# Patient Record
Sex: Male | Born: 1985 | Race: Black or African American | Hispanic: No | Marital: Single | Smoking: Former smoker
Health system: Southern US, Community
[De-identification: ages and names within clinical notes are randomized; demographics above are authoritative.]

## PROBLEM LIST (undated history)

## (undated) HISTORY — PX: HAND SURGERY: SHX662

---

## 2017-09-11 ENCOUNTER — Encounter: Payer: Self-pay | Admitting: Emergency Medicine

## 2017-09-11 ENCOUNTER — Emergency Department
Admission: EM | Admit: 2017-09-11 | Discharge: 2017-09-11 | Disposition: A | Discharge status: Home / Self Care | Payer: Self-pay | Attending: Emergency Medicine | Admitting: Emergency Medicine

## 2017-09-11 ENCOUNTER — Other Ambulatory Visit: Payer: Self-pay

## 2017-09-11 DIAGNOSIS — N342 Other urethritis: Secondary | ICD-10-CM | POA: Insufficient documentation

## 2017-09-11 DIAGNOSIS — Z202 Contact with and (suspected) exposure to infections with a predominantly sexual mode of transmission: Secondary | ICD-10-CM | POA: Insufficient documentation

## 2017-09-11 LAB — URINALYSIS, COMPLETE (UACMP) WITH MICROSCOPIC
BILIRUBIN URINE: NEGATIVE
Bacteria, UA: NONE SEEN
GLUCOSE, UA: NEGATIVE mg/dL
HGB URINE DIPSTICK: NEGATIVE
Ketones, ur: NEGATIVE mg/dL
NITRITE: NEGATIVE
PROTEIN: NEGATIVE mg/dL
Specific Gravity, Urine: 1.021 (ref 1.005–1.030)
Squamous Epithelial / LPF: NONE SEEN (ref 0–5)
pH: 6 (ref 5.0–8.0)

## 2017-09-11 LAB — CHLAMYDIA/NGC RT PCR (ARMC ONLY)
Chlamydia Tr: NOT DETECTED
N gonorrhoeae: DETECTED — AB

## 2017-09-11 MED ORDER — AZITHROMYCIN 500 MG PO TABS
1000.0000 mg | ORAL_TABLET | Freq: Once | ORAL | Status: AC
  Administered 2017-09-11: 1000 mg via ORAL
  Filled 2017-09-11: qty 2

## 2017-09-11 MED ORDER — CEFTRIAXONE SODIUM 1 G IJ SOLR
1.0000 g | Freq: Once | INTRAMUSCULAR | Status: AC
  Administered 2017-09-11: 1 g via INTRAMUSCULAR
  Filled 2017-09-11: qty 10

## 2017-09-11 MED ORDER — LIDOCAINE HCL (PF) 1 % IJ SOLN
5.0000 mL | Freq: Once | INTRAMUSCULAR | Status: AC
  Administered 2017-09-11: 5 mL
  Filled 2017-09-11: qty 5

## 2017-09-11 NOTE — Discharge Instructions (Signed)
Follow-up with the [facility] STD clinic for further testing or treatment if needed.  The services are free.  If you are able to contact your sex partner they need to know that they also need to be treated.

## 2017-09-11 NOTE — ED Provider Notes (Signed)
[facility] Emergency Department Provider Note  ____________________________________________   First MD Initiated Contact with Patient 09/11/17 [phone]     (approximate)  I have reviewed the triage vital signs and the nursing notes.   HISTORY  Chief Complaint Exposure to STD   HPI Walter Montoya is a 32 y.o. male is here with complaint of penile discharge.  Patient states he had a one night stand prior to this.  Patient states that he had a condom on for intercourse but had oral sex prior to.  Patient denies any previous urinary infections or kidney stones.  History reviewed. No pertinent past medical history.  There are no active problems to display for this patient.   History reviewed. No pertinent surgical history.  Prior to Admission medications   Not on File    Allergies Patient has no known allergies.  No family history on file.  Social History Social History   Tobacco Use  . Smoking status: Not on file  Substance Use Topics  . Alcohol use: Not on file  . Drug use: Not on file    Review of Systems Constitutional: No fever/chills Cardiovascular: Denies chest pain. Respiratory: Denies shortness of breath. Gastrointestinal:  No nausea, no vomiting. Genitourinary: Positive for dysuria. ____________________________________________   PHYSICAL EXAM:  VITAL SIGNS: ED Triage Vitals  Enc Vitals Group     BP 09/11/17 0914 (!) 148/87     Pulse Rate 09/11/17 0914 67     Resp 09/11/17 0914 18     Temp 09/11/17 0914 98.1 F (36.7 C)     Temp Source 09/11/17 0914 Oral     SpO2 09/11/17 0914 97 %     Weight 09/11/17 0915 180 lb (81.6 kg)     Height 09/11/17 0915 5\' 11"  (1.803 m)     Head Circumference --      Peak Flow --      Pain Score 09/11/17 0915 0     Pain Loc --      Pain Edu? --      Excl. in GC? --     Constitutional: Alert and oriented. Well appearing and in no acute distress. Eyes: Conjunctivae are normal.  Head:  Atraumatic. Neck: No stridor.   Cardiovascular: Normal rate, regular rhythm. Grossly normal heart sounds.  Good peripheral circulation. Respiratory: Normal respiratory effort.  No retractions. Lungs CTAB. Musculoskeletal: Moves upper and lower extremities then difficulty.  Normal gait was noted. Neurologic:  Normal speech and language. No gross focal neurologic deficits are appreciated.  Skin:  Skin is warm, dry and intact.  No ecchymosis or abrasions were seen. Psychiatric: Mood and affect are normal. Speech and behavior are normal.  ____________________________________________   LABS (all labs ordered are listed, but only abnormal results are displayed)  Labs Reviewed  URINALYSIS, COMPLETE (UACMP) WITH MICROSCOPIC - Abnormal; Notable for the following components:      Result Value   Color, Urine YELLOW (*)    APPearance CLEAR (*)    Leukocytes, UA TRACE (*)    All other components within normal limits  CHLAMYDIA/NGC RT PCR ([facility] ONLY)    PROCEDURES  Procedure(s) performed: None  Procedures  Critical Care performed: No  ____________________________________________   INITIAL IMPRESSION / ASSESSMENT AND PLAN / ED COURSE  As part of my medical decision making, I reviewed the following data within the electronic MEDICAL RECORD NUMBER Notes from prior ED visits and [place] Controlled Substance Database  Follow-up with the [facility] department if  any continued problems.  Patient was treated with Rocephin 1 g IM and Zithromax 1000 mg p.o. ____________________________________________   FINAL CLINICAL IMPRESSION(S) / ED DIAGNOSES  Final diagnoses:  Urethritis  Exposure to STD     ED Discharge Orders    None       Note:  This document was prepared using Dragon voice recognition software and may include unintentional dictation errors.    Tommi RumpsSummers, Gabreille Dardis L, PA-C 09/11/17 1038    Jene EveryKinner, Robert, MD 09/11/17 1201

## 2017-09-11 NOTE — ED Notes (Addendum)
See triage note  States he thinks he was exposed to STD  States he is having urinary freq and burning also

## 2017-09-11 NOTE — ED Triage Notes (Signed)
States here for STD check.

## 2017-09-13 ENCOUNTER — Telehealth: Payer: Self-pay | Admitting: Emergency Medicine

## 2017-09-13 NOTE — Telephone Encounter (Signed)
Called patient to  Notify of std results positive for gonorrhea.  He was treated in the ED. Explained need for  Partner treatment.   Says he is still having dysurea.   I explained that imprvment should be evident within 3 days.  I told him that if this continued he should call health deparment in [place] to have another test done to confirm cure.  He agrees.

## 2017-10-13 ENCOUNTER — Emergency Department (HOSPITAL_COMMUNITY): Payer: Self-pay

## 2017-10-13 ENCOUNTER — Encounter (HOSPITAL_COMMUNITY): Payer: Self-pay | Admitting: Family Medicine

## 2017-10-13 ENCOUNTER — Encounter (HOSPITAL_COMMUNITY): Payer: Self-pay | Admitting: *Deleted

## 2017-10-13 ENCOUNTER — Emergency Department (HOSPITAL_COMMUNITY)
Admission: EM | Admit: 2017-10-13 | Discharge: 2017-10-13 | Disposition: A | Discharge status: Home / Self Care | Payer: Self-pay | Attending: Emergency Medicine | Admitting: Emergency Medicine

## 2017-10-13 DIAGNOSIS — Z9889 Other specified postprocedural states: Secondary | ICD-10-CM | POA: Insufficient documentation

## 2017-10-13 DIAGNOSIS — R197 Diarrhea, unspecified: Secondary | ICD-10-CM | POA: Insufficient documentation

## 2017-10-13 DIAGNOSIS — F17210 Nicotine dependence, cigarettes, uncomplicated: Secondary | ICD-10-CM | POA: Insufficient documentation

## 2017-10-13 DIAGNOSIS — Z5321 Procedure and treatment not carried out due to patient leaving prior to being seen by health care provider: Secondary | ICD-10-CM | POA: Insufficient documentation

## 2017-10-13 DIAGNOSIS — R5383 Other fatigue: Secondary | ICD-10-CM | POA: Insufficient documentation

## 2017-10-13 DIAGNOSIS — R109 Unspecified abdominal pain: Secondary | ICD-10-CM | POA: Insufficient documentation

## 2017-10-13 DIAGNOSIS — A09 Infectious gastroenteritis and colitis, unspecified: Secondary | ICD-10-CM | POA: Insufficient documentation

## 2017-10-13 LAB — COMPREHENSIVE METABOLIC PANEL
ALBUMIN: 3.7 g/dL (ref 3.5–5.0)
ALT: 45 U/L — ABNORMAL HIGH (ref 0–44)
ANION GAP: 5 (ref 5–15)
AST: 35 U/L (ref 15–41)
Alkaline Phosphatase: 87 U/L (ref 38–126)
BILIRUBIN TOTAL: 0.3 mg/dL (ref 0.3–1.2)
BUN: 13 mg/dL (ref 6–20)
CALCIUM: 8.6 mg/dL — AB (ref 8.9–10.3)
CHLORIDE: 109 mmol/L (ref 98–111)
CO2: 27 mmol/L (ref 22–32)
Creatinine, Ser: 1 mg/dL (ref 0.61–1.24)
GFR calc Af Amer: >60 mL/min (ref >60)
GFR calc non Af Amer: >60 mL/min (ref >60)
GLUCOSE: 125 mg/dL — AB (ref 70–99)
POTASSIUM: 3.9 mmol/L (ref 3.5–5.1)
SODIUM: 141 mmol/L (ref 135–145)
Total Protein: 6.7 g/dL (ref 6.5–8.1)

## 2017-10-13 LAB — CBC WITH DIFFERENTIAL/PLATELET
BASOS ABS: 0 10*3/uL (ref 0.0–0.1)
BASOS PCT: 1 %
Eosinophils Absolute: 0.3 10*3/uL (ref 0.0–0.7)
Eosinophils Relative: 3 %
HEMATOCRIT: 49.8 % (ref 39.0–52.0)
Hemoglobin: 16.3 g/dL (ref 13.0–17.0)
Lymphocytes Relative: 28 %
Lymphs Abs: 2.3 10*3/uL (ref 0.7–4.0)
MCH: 29.1 pg (ref 26.0–34.0)
MCHC: 32.7 g/dL (ref 30.0–36.0)
MCV: 88.9 fL (ref 78.0–100.0)
MONO ABS: 0.8 10*3/uL (ref 0.1–1.0)
Monocytes Relative: 10 %
NEUTROS ABS: 4.7 10*3/uL (ref 1.7–7.7)
NEUTROS PCT: 58 %
Platelets: 164 10*3/uL (ref 150–400)
RBC: 5.6 MIL/uL (ref 4.22–5.81)
RDW: 14.9 % (ref 11.5–15.5)
WBC: 8.1 10*3/uL (ref 4.0–10.5)

## 2017-10-13 NOTE — ED Provider Notes (Signed)
[facility]-EMERGENCY DEPT Provider Note   CSN: [id number] Arrival date & time: 10/13/17  0813     History   Chief Complaint Chief Complaint  Patient presents with  . Diarrhea  . multiple complaints    HPI Walter Montoya is a 32 y.o. male.  Patient complains of diarrhea over the weekend that has improved.  He states that he feels like he could still have infection in his rectum.  The history is provided by the patient. No language interpreter was used.  Diarrhea   This is a new problem. The current episode started 2 days ago. The problem occurs 5 to 10 times per day. The problem has been resolved. The stool consistency is described as watery. There has been no fever. Pertinent negatives include no abdominal pain, no chills, no headaches and no cough. He has tried nothing for the symptoms. The treatment provided no relief. Risk factors include suspect food intake. His past medical history does not include irritable bowel syndrome.    History reviewed. No pertinent past medical history.  There are no active problems to display for this patient.   History reviewed. No pertinent surgical history.      Home Medications    Prior to Admission medications   Not on File    Family History No family history on file.  Social History Social History   Tobacco Use  . Smoking status: Current Every Day Smoker  . Smokeless tobacco: Never Used  Substance Use Topics  . Alcohol use: Yes  . Drug use: Not on file     Allergies   Citrus   Review of Systems Review of Systems  Constitutional: Negative for appetite change, chills and fatigue.  HENT: Negative for congestion, ear discharge and sinus pressure.   Eyes: Negative for discharge.  Respiratory: Negative for cough.   Cardiovascular: Negative for chest pain.  Gastrointestinal: Positive for diarrhea. Negative for abdominal pain.  Genitourinary: Negative for frequency and hematuria.    Musculoskeletal: Negative for back pain.  Skin: Negative for rash.  Neurological: Negative for seizures and headaches.  Psychiatric/Behavioral: Negative for hallucinations.     Physical Exam Updated Vital Signs BP 138/88   Pulse 77   Temp 97.6 F (36.4 C) (Oral)   Resp 16   Ht 5\' 11"  (1.803 m)   Wt 88.5 kg (195 lb)   SpO2 100%   BMI 27.20 kg/m   Physical Exam  Constitutional: He is oriented to person, place, and time. He appears well-developed.  HENT:  Head: Normocephalic.  Eyes: Conjunctivae and EOM are normal. No scleral icterus.  Neck: Neck supple. No thyromegaly present.  Cardiovascular: Normal rate and regular rhythm. Exam reveals no gallop and no friction rub.  No murmur heard. Pulmonary/Chest: No stridor. He has no wheezes. He has no rales. He exhibits no tenderness.  Abdominal: He exhibits no distension. There is no tenderness. There is no rebound.  Genitourinary:  Genitourinary Comments: Patient refused rectal exam  Musculoskeletal: Normal range of motion. He exhibits no edema.  Lymphadenopathy:    He has no cervical adenopathy.  Neurological: He is oriented to person, place, and time. He exhibits normal muscle tone. Coordination normal.  Skin: No rash noted. No erythema.  Psychiatric: He has a normal mood and affect. His behavior is normal.     ED Treatments / Results  Labs (all labs ordered are listed, but only abnormal results are displayed) Labs Reviewed  COMPREHENSIVE METABOLIC PANEL - Abnormal; Notable for the following  components:      Result Value   Glucose, Bld 125 (*)    Calcium 8.6 (*)    ALT 45 (*)    All other components within normal limits  CBC WITH DIFFERENTIAL/PLATELET    EKG None  Radiology Dg Abd Acute W/chest  Result Date: 10/13/2017 CLINICAL DATA:  Diarrhea and stomach pain. EXAM: DG ABDOMEN ACUTE W/ 1V CHEST COMPARISON:  None. FINDINGS: Lungs are clear. Heart and mediastinum are within normal limits. Negative for free air.  Normal bowel gas pattern with small amount of stool in the abdomen and pelvis. No large abdominal calcifications. Bone structures are unremarkable. IMPRESSION: Negative abdominal radiographs.  No acute cardiopulmonary disease. Electronically Signed   By: Richarda Overlie M.D.   On: 10/13/2017 09:48    Procedures Procedures (including critical care time)  Medications Ordered in ED Medications - No data to display   Initial Impression / Assessment and Plan / ED Course  I have reviewed the triage vital signs and the nursing notes.  Pertinent labs & imaging results that were available during my care of the patient were reviewed by me and considered in my medical decision making (see chart for details).   Gastroenteritis resolved.  Patient will follow-up with PCP as needed  Final Clinical Impressions(s) / ED Diagnoses   Final diagnoses:  Diarrhea of infectious origin    ED Discharge Orders    None       Bethann Berkshire, MD 10/13/17 1100

## 2017-10-13 NOTE — ED Triage Notes (Addendum)
Patient complains of diarrhea and feels something is moving inside his rectum. Pt reports he had shigella about 2 years ago. Also, states he had abd pain last week. Pt denies any recent foreign travel. However, he would also like to be checked for some type of cancer, states he is sleeping more, has red eyes, and a mole under his left arm that he pulled off last week. [facility]. Patient denies any recent change from prior visit today. Will not initiate any new orders until a medical screen is performed since there is no change in his condition.

## 2017-10-13 NOTE — ED Triage Notes (Signed)
Pt complains of diarrhea and feels something is moving inside his rectum. Pt is concerned he has some type of parasite. Pt states he had stomach pain last week and was concerned he had shigella. Pt denies recent foreign travel. Pt states he would also like to be checked for some type of cancer, states he is sleeping more, has red eyes and a mole under his left arm that he pulled off last week. .Marland Kitchen

## 2017-10-13 NOTE — Discharge Instructions (Addendum)
Drink plenty of fluids and follow-up with your primary care doctor next week if any problems

## 2017-10-14 ENCOUNTER — Emergency Department (HOSPITAL_COMMUNITY)
Admission: EM | Admit: 2017-10-14 | Discharge: 2017-10-14 | Disposition: A | Discharge status: Home / Self Care | Payer: Self-pay | Attending: Emergency Medicine | Admitting: Emergency Medicine

## 2017-10-14 NOTE — ED Notes (Signed)
Called pt. To triage x 2 no answer.

## 2018-05-23 ENCOUNTER — Other Ambulatory Visit: Payer: Self-pay

## 2018-05-23 ENCOUNTER — Encounter: Payer: Self-pay | Admitting: Emergency Medicine

## 2018-05-23 ENCOUNTER — Emergency Department
Admission: EM | Admit: 2018-05-23 | Discharge: 2018-05-23 | Disposition: A | Discharge status: Home / Self Care | Payer: Self-pay | Attending: Emergency Medicine | Admitting: Emergency Medicine

## 2018-05-23 DIAGNOSIS — A549 Gonococcal infection, unspecified: Secondary | ICD-10-CM | POA: Insufficient documentation

## 2018-05-23 DIAGNOSIS — A749 Chlamydial infection, unspecified: Secondary | ICD-10-CM | POA: Insufficient documentation

## 2018-05-23 LAB — URINALYSIS, COMPLETE (UACMP) WITH MICROSCOPIC
BILIRUBIN URINE: NEGATIVE
Glucose, UA: NEGATIVE mg/dL
Hgb urine dipstick: NEGATIVE
KETONES UR: NEGATIVE mg/dL
Nitrite: NEGATIVE
Protein, ur: NEGATIVE mg/dL
SQUAMOUS EPITHELIAL / LPF: NONE SEEN (ref 0–5)
Specific Gravity, Urine: 1.025 (ref 1.005–1.030)
pH: 8 (ref 5.0–8.0)

## 2018-05-23 LAB — CHLAMYDIA/NGC RT PCR (ARMC ONLY)
CHLAMYDIA TR: DETECTED — AB
N gonorrhoeae: DETECTED — AB

## 2018-05-23 MED ORDER — AZITHROMYCIN 500 MG PO TABS
1000.0000 mg | ORAL_TABLET | Freq: Once | ORAL | Status: AC
  Administered 2018-05-23: 1000 mg via ORAL
  Filled 2018-05-23: qty 2

## 2018-05-23 MED ORDER — CEFTRIAXONE SODIUM 250 MG IJ SOLR
250.0000 mg | Freq: Once | INTRAMUSCULAR | Status: AC
  Administered 2018-05-23: 250 mg via INTRAMUSCULAR
  Filled 2018-05-23: qty 250

## 2018-05-23 NOTE — ED Notes (Signed)
Pt came out of room and spoke with PA Ron regarding test results and status. PA informed pt that it would take 2 hours for results. Pt wanting to leave and come back to room. Pt advised if he leaves room he would have to restart process. Pt states he is going to get his charger.

## 2018-05-23 NOTE — ED Triage Notes (Signed)
Patient pulled up name of abx on his cellphone - "Valacyclobir 1000 mg.", states he took it twice a day X 7 days with no improvement in sx.

## 2018-05-23 NOTE — Discharge Instructions (Signed)
Advised to follow-up with [facility] department.
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
## 2018-05-23 NOTE — ED Provider Notes (Signed)
[facility] Emergency Department Provider Note   ____________________________________________   None    (approximate)  I have reviewed the triage vital signs and the nursing notes.   HISTORY  Chief Complaint    HPI Walter Montoya is a 33 y.o. male patient presents with penile discharge x1 week.  Patient denies known exposure to STD.  Patient state he has some leftover antibiotics which he took but no relief.  Review of the past medicine the patient taken was antiviral and not an antibiotic.  Patient denies penile lesions or pain.         History reviewed. No pertinent past medical history.  There are no active problems to display for this patient.   Past Surgical History:  Procedure Laterality Date  . HAND SURGERY Right    Skin graft     Prior to Admission medications   Not on File    Allergies Citrus  No family history on file.  Social History Social History   Tobacco Use  . Smoking status: Former Smoker    Packs/day: 0.50    Types: Cigarettes  . Smokeless tobacco: Never Used  Substance Use Topics  . Alcohol use: Yes    Comment: Daily. Last drink: yesterday   . Drug use: Not Currently    Review of Systems Constitutional: No fever/chills Eyes: No visual changes. ENT: No sore throat. Cardiovascular: Denies chest pain. Respiratory: Denies shortness of breath. Gastrointestinal: No abdominal pain.  No nausea, no vomiting.  No diarrhea.  No constipation. Genitourinary: Negative for dysuria.  Penile discharge Musculoskeletal: Negative for back pain. Skin: Negative for rash. Neurological: Negative for headaches, focal weakness or numbness.   ____________________________________________   PHYSICAL EXAM:  VITAL SIGNS: ED Triage Vitals  Enc Vitals Group     BP 05/23/18 1117 (!) 146/83     Pulse Rate 05/23/18 1117 64     Resp 05/23/18 1117 16     Temp 05/23/18 1117 98.4 F (36.9 C)     Temp Source 05/23/18 1117  Oral     SpO2 05/23/18 1117 99 %     Weight 05/23/18 1118 195 lb (88.5 kg)     Height 05/23/18 1118 5\' 11"  (1.803 m)     Head Circumference --      Peak Flow --      Pain Score 05/23/18 1121 0     Pain Loc --      Pain Edu? --      Excl. in GC? --     Constitutional: Alert and oriented. Well appearing and in no acute distress. Cardiovascular: Normal rate, regular rhythm. Grossly normal heart sounds.  Good peripheral circulation. Respiratory: Normal respiratory effort.  No retractions. Lungs CTAB. Gastrointestinal: Soft and nontender. No distention. No abdominal bruits. No CVA tenderness. Genitourinary no urethral discharge visible. Musculoskeletal: No lower extremity tenderness nor edema.  No joint effusions. Neurologic:  Normal speech and language. No gross focal neurologic deficits are appreciated. No gait instability. Skin:  Skin is warm, dry and intact. No rash noted. Psychiatric: Mood and affect are normal. Speech and behavior are normal.  ____________________________________________   LABS (all labs ordered are listed, but only abnormal results are displayed)  Labs Reviewed  CHLAMYDIA/NGC RT PCR ([facility] ONLY) - Abnormal; Notable for the following components:      Result Value   Chlamydia Tr DETECTED (*)    N gonorrhoeae DETECTED (*)    All other components within normal limits  URINALYSIS, COMPLETE (UACMP) WITH  MICROSCOPIC - Abnormal; Notable for the following components:   Color, Urine YELLOW (*)    APPearance CLOUDY (*)    Leukocytes,Ua TRACE (*)    Bacteria, UA RARE (*)    All other components within normal limits   ____________________________________________  EKG   ____________________________________________  RADIOLOGY  ED MD interpretation:    Official radiology report(s): No results found.  ____________________________________________   PROCEDURES  Procedure(s) performed (including Critical  Care):  Procedures   ____________________________________________   INITIAL IMPRESSION / ASSESSMENT AND PLAN / ED COURSE  As part of my medical decision making, I reviewed the following data within the electronic MEDICAL RECORD NUMBER     Patient presents with penile discharge for 1 week.  Patient was positive for gonorrhea and chlamydia.  Patient was treated prior to departure with Rocephin and azithromycin.  Patient advised follow-up with [facility] community health department.  Patient advised to notify sexual partner.     ____________________________________________   FINAL CLINICAL IMPRESSION(S) / ED DIAGNOSES  Final diagnoses:  Gonorrhea in male  Chlamydia     ED Discharge Orders    None       Note:  This document was prepared using Dragon voice recognition software and may include unintentional dictation errors.    Joni Reining, PA-C 05/23/18 1356    Sharman Cheek, MD 05/27/18 2144

## 2018-05-23 NOTE — ED Notes (Signed)
Pt here for discharge from penis. Pt has been taking antibiotics with no improvement

## 2020-01-17 IMAGING — CR DG ABDOMEN ACUTE W/ 1V CHEST
3 series · 3 of 3 positions shown · non-contrast
Comparison: None.

CLINICAL DATA: Diarrhea and stomach pain.

EXAM:
DG ABDOMEN ACUTE W/ 1V CHEST

[w chest pa]
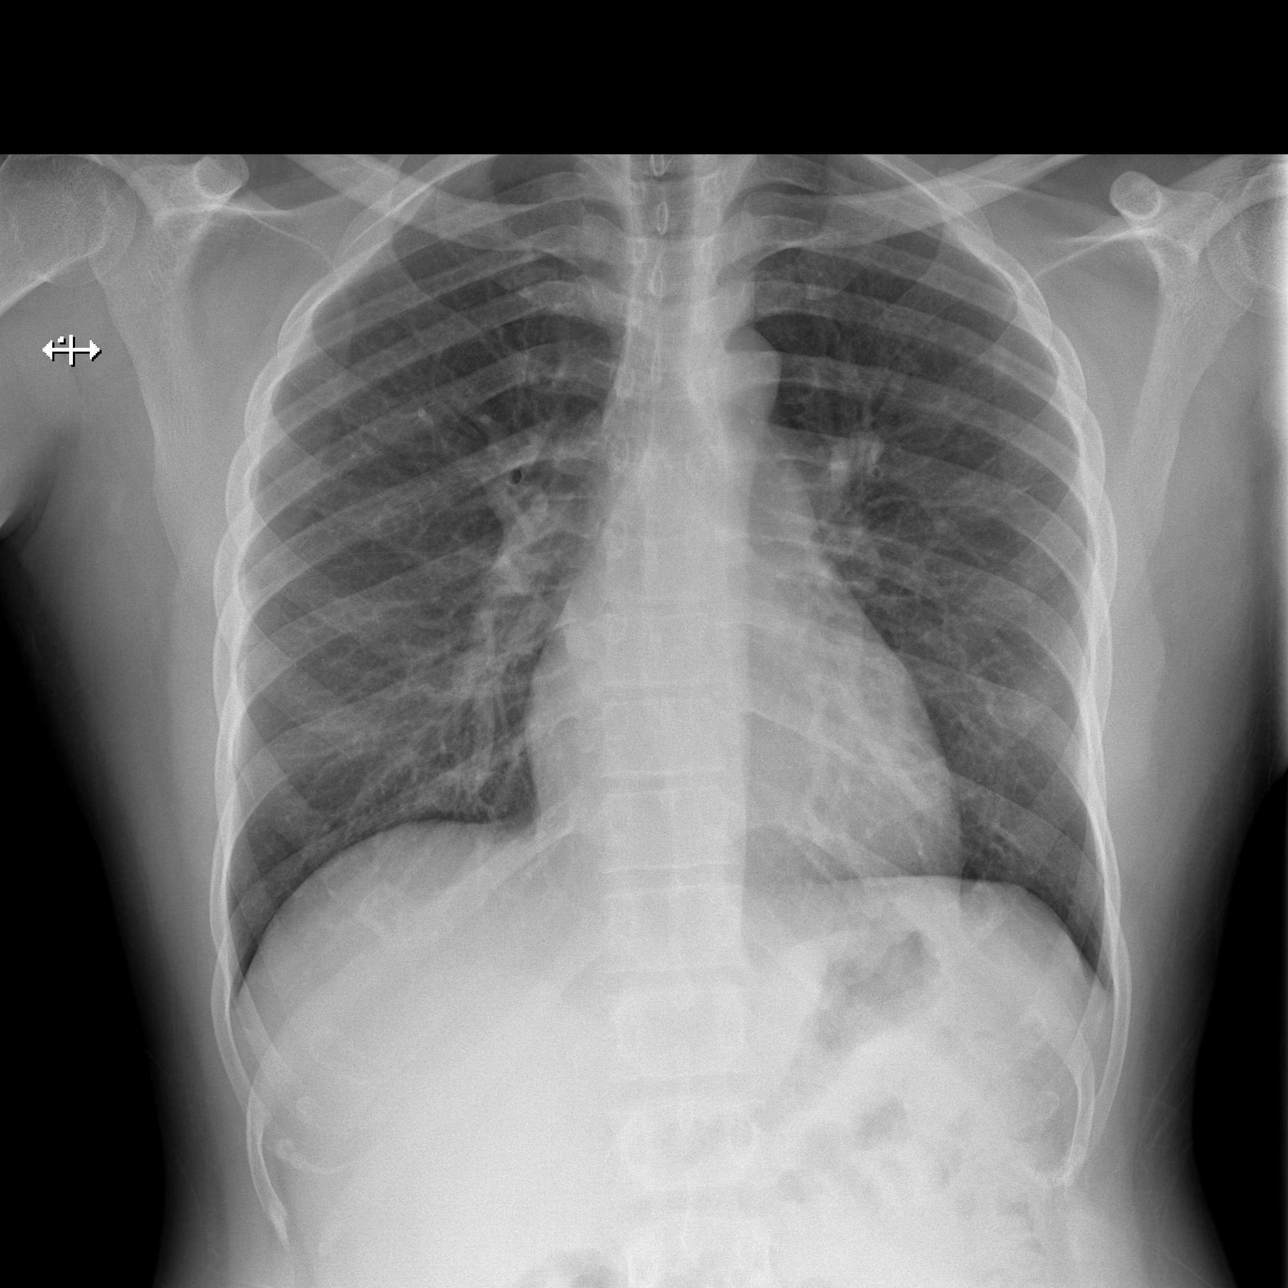

[w abdomen upright]
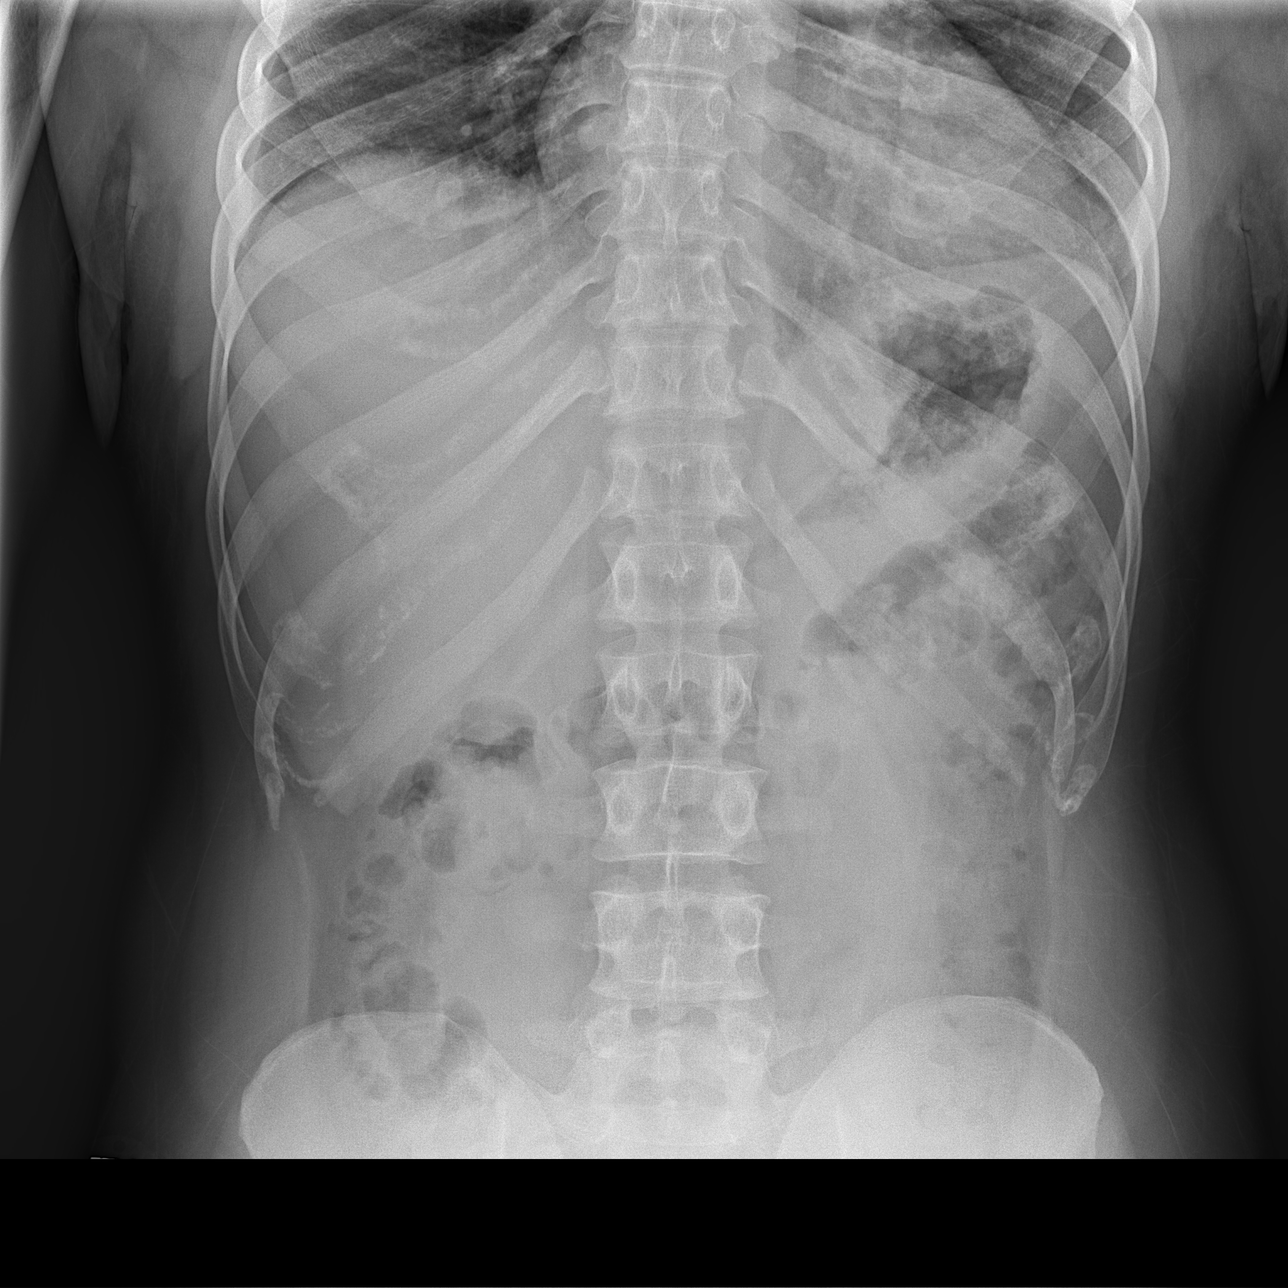

[t abdomen supine]
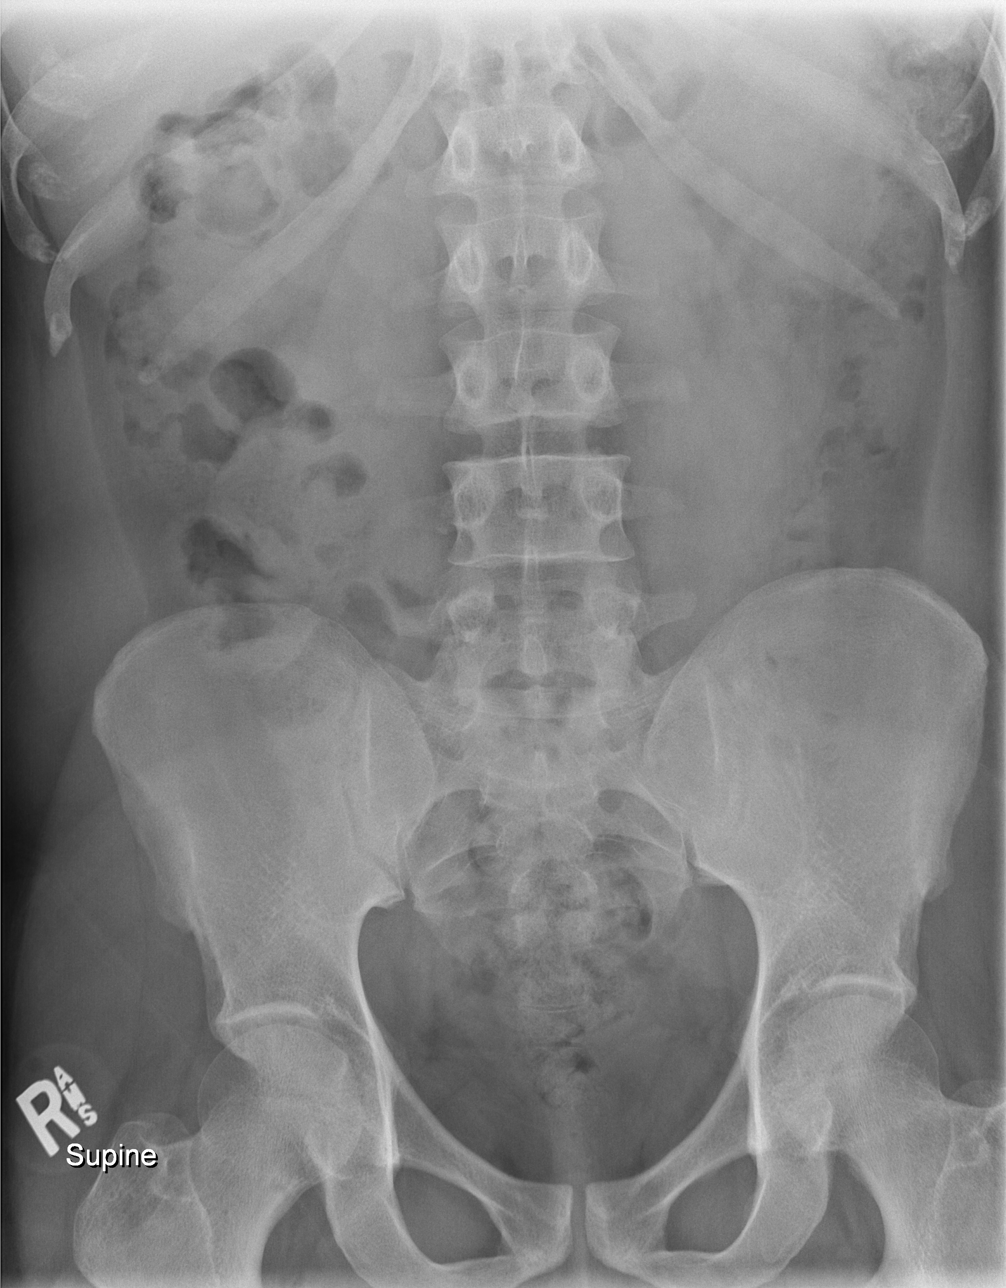

[3 of 3 positions shown; findings below may reference images not displayed]

FINDINGS: Lungs are clear. Heart and mediastinum are within normal limits.
Negative for free air. Normal bowel gas pattern with small amount of
stool in the abdomen and pelvis. No large abdominal calcifications.
Bone structures are unremarkable.
IMPRESSION: Negative abdominal radiographs.  No acute cardiopulmonary disease.
